# Patient Record
Sex: Male | Born: 1960 | Race: White | Hispanic: No | Marital: Single | Smoking: Never smoker
Health system: Southern US, Community
[De-identification: ages and names within clinical notes are randomized; demographics above are authoritative.]

## PROBLEM LIST (undated history)

## (undated) DIAGNOSIS — I10 Essential (primary) hypertension: Secondary | ICD-10-CM

## (undated) DIAGNOSIS — N200 Calculus of kidney: Secondary | ICD-10-CM

## (undated) HISTORY — PX: KNEE SURGERY: SHX244

---

## 1998-02-21 ENCOUNTER — Emergency Department (HOSPITAL_COMMUNITY): Admission: EM | Admit: 1998-02-21 | Discharge: 1998-02-21 | Payer: Self-pay | Admitting: Emergency Medicine

## 1998-04-08 ENCOUNTER — Emergency Department (HOSPITAL_COMMUNITY): Admission: EM | Admit: 1998-04-08 | Discharge: 1998-04-08 | Payer: Self-pay | Admitting: Emergency Medicine

## 1998-04-22 ENCOUNTER — Emergency Department (HOSPITAL_COMMUNITY): Admission: EM | Admit: 1998-04-22 | Discharge: 1998-04-22 | Payer: Self-pay | Admitting: Emergency Medicine

## 1998-10-19 ENCOUNTER — Emergency Department (HOSPITAL_COMMUNITY): Admission: EM | Admit: 1998-10-19 | Discharge: 1998-10-19 | Payer: Self-pay | Admitting: Emergency Medicine

## 1999-01-04 ENCOUNTER — Emergency Department (HOSPITAL_COMMUNITY): Admission: EM | Admit: 1999-01-04 | Discharge: 1999-01-04 | Payer: Self-pay | Admitting: Emergency Medicine

## 1999-05-25 ENCOUNTER — Encounter: Payer: Self-pay | Admitting: Urology

## 1999-05-25 ENCOUNTER — Ambulatory Visit (HOSPITAL_BASED_OUTPATIENT_CLINIC_OR_DEPARTMENT_OTHER): Admission: RE | Admit: 1999-05-25 | Discharge: 1999-05-25 | Payer: Self-pay | Admitting: Urology

## 2000-02-19 ENCOUNTER — Emergency Department (HOSPITAL_COMMUNITY): Admission: EM | Admit: 2000-02-19 | Discharge: 2000-02-19 | Payer: Self-pay | Admitting: *Deleted

## 2001-01-17 ENCOUNTER — Encounter: Payer: Self-pay | Admitting: Emergency Medicine

## 2001-01-17 ENCOUNTER — Emergency Department (HOSPITAL_COMMUNITY): Admission: EM | Admit: 2001-01-17 | Discharge: 2001-01-17 | Payer: Self-pay | Admitting: Emergency Medicine

## 2001-02-01 ENCOUNTER — Ambulatory Visit (HOSPITAL_COMMUNITY): Admission: RE | Admit: 2001-02-01 | Discharge: 2001-02-01 | Payer: Self-pay | Admitting: Orthopedic Surgery

## 2001-02-01 ENCOUNTER — Encounter: Payer: Self-pay | Admitting: Specialist

## 2001-02-27 ENCOUNTER — Ambulatory Visit (HOSPITAL_COMMUNITY): Admission: RE | Admit: 2001-02-27 | Discharge: 2001-02-27 | Payer: Self-pay | Admitting: Specialist

## 2001-11-17 ENCOUNTER — Encounter: Payer: Self-pay | Admitting: Internal Medicine

## 2001-11-17 ENCOUNTER — Inpatient Hospital Stay (HOSPITAL_COMMUNITY): Admission: EM | Admit: 2001-11-17 | Discharge: 2001-11-18 | Payer: Self-pay | Admitting: Emergency Medicine

## 2010-01-27 ENCOUNTER — Emergency Department (HOSPITAL_COMMUNITY): Admission: EM | Admit: 2010-01-27 | Discharge: 2010-01-27 | Payer: Self-pay | Admitting: Emergency Medicine

## 2010-09-24 LAB — COMPREHENSIVE METABOLIC PANEL
ALT: 30 U/L (ref 0–53)
AST: 30 U/L (ref 0–37)
Albumin: 4.4 g/dL (ref 3.5–5.2)
Alkaline Phosphatase: 66 U/L (ref 39–117)
BUN: 20 mg/dL (ref 6–23)
CO2: 27 mEq/L (ref 19–32)
Calcium: 9.7 mg/dL (ref 8.4–10.5)
Chloride: 105 mEq/L (ref 96–112)
Creatinine, Ser: 1.52 mg/dL — ABNORMAL HIGH (ref 0.4–1.5)
GFR calc Af Amer: 59 mL/min — ABNORMAL LOW (ref >60)
GFR calc non Af Amer: 49 mL/min — ABNORMAL LOW (ref >60)
Glucose, Bld: 127 mg/dL — ABNORMAL HIGH (ref 70–99)
Potassium: 4.4 mEq/L (ref 3.5–5.1)
Sodium: 141 mEq/L (ref 135–145)
Total Bilirubin: 0.9 mg/dL (ref 0.3–1.2)
Total Protein: 7.3 g/dL (ref 6.0–8.3)

## 2010-09-24 LAB — URINALYSIS, ROUTINE W REFLEX MICROSCOPIC
Bilirubin Urine: NEGATIVE
Glucose, UA: NEGATIVE mg/dL
Leukocytes, UA: NEGATIVE
Nitrite: NEGATIVE
Protein, ur: NEGATIVE mg/dL
Specific Gravity, Urine: 1.015 (ref 1.005–1.030)
Urobilinogen, UA: 0.2 mg/dL (ref 0.0–1.0)
pH: 7 (ref 5.0–8.0)

## 2010-09-24 LAB — URINE MICROSCOPIC-ADD ON

## 2010-11-25 NOTE — Op Note (Signed)
[place]  Patient:    Cody Haynes                         MRN: [id number] Proc. Date: 05/25/99 Adm. Date:  [id number] Attending:  Katherine Roan                           Operative Report  PREOPERATIVE DIAGNOSIS:  Right distal ureteral obstruction with history of pain and hematuria, rule out ureterocele.  POSTOPERATIVE DIAGNOSIS:  Right distal ureteral obstruction with history of pain and hematuria, rule out ureterocele.  OPERATION PERFORMED:  Cystoscopy, right ureteral balloon dilation and ureteroscopy.  SURGEON:  Rozanna Boer., M.D.  ANESTHESIA:  General.  BRIEF HISTORY:  This 50 year old white male has had at least four episodes of pain in the last year.  The first was September 1999, the next was in March 2000 and  then in June again it happened and this time of CT scan a 3 mm distal right ureteral stone was seen on IVP.  This was later gone on KUB in July but the stone was never recovered.  He had recurrence of pain last month and enters now for dilation of what looks to be a ureterocele on IVP.  The distal ureter causing intermittent obstruction with small stones that he apparently is passing.  DESCRIPTION OF PROCEDURE:  The patient was placed in the dorsal lithotomy position and prepped and draped with Betadine in the usual sterile fashion and given IV Ancef and Tobramycin IV.  A #21 panendoscope was used to inspect the anterior urethra and no strictures were seen.  The posterior urethra was not obstructed nd the bladder was entered.  Pictures were made of the ureteral orifices on both sides.  The right was a little bit smaller but in normal location.  It was not  typical ureterocele but did appear to be much tighter and smaller than the one n the left.  A guide wire was passed up the ureter and under fluoroscopy, no stones could be seen.  Over the guide wire a 4 cm ureteral balloon dilator was  passed,  inflated to 14 atmospheres and kept maintained there for five timed minutes. When this was done, the orifice now looked normal as did the left.  A 7 French ureteroscope was then passed up the ureter alongside the guide wire but no stones were seen.  The scope was passed all the way up to the proximal third of the ureter without difficulty.  The scope was then removed and over the guide wire a 6 [place] by 26 cm length double-J ureteral stent was fashioned under fluoroscopy with a string on the distal end.  The guide wire was removed.  The string was then taped to the penis after draining the bladder.  The patient was then taken to the recovery room in good condition to be later discharged as an outpatient. DD:  05/25/99 TD:  05/26/99 Job: [id number]

## 2010-11-25 NOTE — Op Note (Signed)
[facility]  Patient:    Cody Haynes, Cody Haynes Visit Number: [id number] MRN: [id number]          Service Type: DSU Location: DAY Attending Physician:  Rocky Crafts Proc. Date: 02/27/01 Adm. Date:  02/27/2001                             Operative Report  PREOPERATIVE DIAGNOSIS:  Torn lateral meniscus bucket handle.  POSTOPERATIVE DIAGNOSIS:  Torn lateral meniscus bucket handle.  PROCEDURE:  Partial lateral meniscectomy.  DESCRIPTION OF PROCEDURE:  After suitable general anesthesia, the leg is exsanguinated and upper thigh tourniquet inflated to 350 mmHg. He was then placed into the leg holder and prepped and draped routinely. Arthroscopy was carried out with the usual portals with the lateral parapatellar inflow portal and an anterolateral portal for the scope and anteromedial instrumentation. Pertinent pathology was confined to the lateral joint where there was a bucket handle tear of the lateral meniscus which was locked but was reducible. It was felt that removal was indicated even though it was pretty close to the red zone. The meniscus was then excised with a combination of punch forceps and rotatory meniscotome leaving a nice smooth posterior portal and tapering it at the middle third with plenty of meniscus between it and the obtuse tendon. Good stable meniscus remaining. At the end of the procedure, 20 cc of 0.5% plain Marcaine into the knee with some additional plain Marcaine in the punch wounds. A nice compression dressing and he goes to recovery in good condition. The tourniquet was down in under an hour. He goes to recovery in good condition. Attending Physician:  Rocky Crafts DD:  02/27/01 TD:  02/27/01 Job: [id number]

## 2011-06-05 ENCOUNTER — Emergency Department (HOSPITAL_COMMUNITY)
Admission: EM | Admit: 2011-06-05 | Discharge: 2011-06-05 | Disposition: A | Discharge status: Home / Self Care | Payer: Managed Care, Other (non HMO) | Attending: Emergency Medicine | Admitting: Emergency Medicine

## 2011-06-05 ENCOUNTER — Emergency Department (HOSPITAL_COMMUNITY): Payer: Managed Care, Other (non HMO)

## 2011-06-05 DIAGNOSIS — K7689 Other specified diseases of liver: Secondary | ICD-10-CM | POA: Insufficient documentation

## 2011-06-05 DIAGNOSIS — R109 Unspecified abdominal pain: Secondary | ICD-10-CM | POA: Insufficient documentation

## 2011-06-05 DIAGNOSIS — N134 Hydroureter: Secondary | ICD-10-CM

## 2011-06-05 DIAGNOSIS — N1330 Unspecified hydronephrosis: Secondary | ICD-10-CM | POA: Insufficient documentation

## 2011-06-05 DIAGNOSIS — I10 Essential (primary) hypertension: Secondary | ICD-10-CM | POA: Insufficient documentation

## 2011-06-05 DIAGNOSIS — N201 Calculus of ureter: Secondary | ICD-10-CM | POA: Insufficient documentation

## 2011-06-05 HISTORY — DX: Essential (primary) hypertension: I10

## 2011-06-05 LAB — DIFFERENTIAL
Basophils Relative: 0 % (ref 0–1)
Eosinophils Absolute: 0.1 10*3/uL (ref 0.0–0.7)
Eosinophils Relative: 1 % (ref 0–5)
Monocytes Relative: 3 % (ref 3–12)
Neutrophils Relative %: 88 % — ABNORMAL HIGH (ref 43–77)

## 2011-06-05 LAB — BASIC METABOLIC PANEL
BUN: 26 mg/dL — ABNORMAL HIGH (ref 6–23)
Calcium: 10.2 mg/dL (ref 8.4–10.5)
Creatinine, Ser: 1.44 mg/dL — ABNORMAL HIGH (ref 0.50–1.35)
GFR calc Af Amer: 64 mL/min — ABNORMAL LOW (ref >90)
GFR calc non Af Amer: 55 mL/min — ABNORMAL LOW (ref >90)
Potassium: 3.8 mEq/L (ref 3.5–5.1)

## 2011-06-05 LAB — CBC
Hemoglobin: 14.2 g/dL (ref 13.0–17.0)
MCH: 31 pg (ref 26.0–34.0)
MCHC: 35.2 g/dL (ref 30.0–36.0)
MCV: 88 fL (ref 78.0–100.0)

## 2011-06-05 LAB — URINALYSIS, ROUTINE W REFLEX MICROSCOPIC
Bilirubin Urine: NEGATIVE
Nitrite: NEGATIVE
Urobilinogen, UA: 0.2 mg/dL (ref 0.0–1.0)
pH: 5.5 (ref 5.0–8.0)

## 2011-06-05 LAB — URINE MICROSCOPIC-ADD ON

## 2011-06-05 MED ORDER — TAMSULOSIN HCL 0.4 MG PO CAPS: 0.4000 mg | ORAL_CAPSULE | Freq: Every day | ORAL | Status: DC

## 2011-06-05 MED ORDER — MORPHINE SULFATE 4 MG/ML IJ SOLN
8.0000 mg | Freq: Once | INTRAMUSCULAR | Status: AC
  Administered 2011-06-05: 8 mg via INTRAVENOUS
  Filled 2011-06-05: qty 2

## 2011-06-05 MED ORDER — SODIUM CHLORIDE 0.9 % IV BOLUS (SEPSIS)
1000.0000 mL | Freq: Once | INTRAVENOUS | Status: AC
  Administered 2011-06-05: 1000 mL via INTRAVENOUS

## 2011-06-05 MED ORDER — OXYCODONE-ACETAMINOPHEN 5-325 MG PO TABS: 1.0000 | ORAL_TABLET | Freq: Four times a day (QID) | ORAL | Status: AC | PRN

## 2011-06-05 MED ORDER — ONDANSETRON HCL 4 MG/2ML IJ SOLN
4.0000 mg | Freq: Once | INTRAMUSCULAR | Status: AC
Start: 2011-06-05 — End: 2011-06-05
  Administered 2011-06-05: 4 mg via INTRAVENOUS
  Filled 2011-06-05: qty 2

## 2011-06-05 MED ORDER — PROMETHAZINE HCL 12.5 MG PO TABS: 12.5000 mg | ORAL_TABLET | Freq: Four times a day (QID) | ORAL | Status: AC | PRN

## 2011-06-05 MED ORDER — OXYCODONE-ACETAMINOPHEN 5-325 MG PO TABS
2.0000 | ORAL_TABLET | Freq: Once | ORAL | Status: AC
  Administered 2011-06-05: 2 via ORAL
  Filled 2011-06-05: qty 2

## 2011-06-05 MED ORDER — NAPROXEN 500 MG PO TABS: 500.0000 mg | ORAL_TABLET | Freq: Two times a day (BID) | ORAL | Status: AC

## 2011-06-05 MED ORDER — KETOROLAC TROMETHAMINE 30 MG/ML IJ SOLN
30.0000 mg | Freq: Once | INTRAMUSCULAR | Status: AC
  Administered 2011-06-05: 30 mg via INTRAVENOUS
  Filled 2011-06-05: qty 1

## 2011-06-05 NOTE — ED Notes (Signed)
Hx kidney stones. Has n/v since 0700

## 2011-06-05 NOTE — ED Provider Notes (Signed)
History     CSN: [id number] Arrival date & time: 06/05/2011  9:41 AM   First MD Initiated Contact with Patient 06/05/11 1024      Chief Complaint  Patient presents with  . Nephrolithiasis    (Consider location/radiation/quality/duration/timing/severity/associated sxs/prior treatment) HPI Comments: History of kidney stones. Today at 7 AM with right flank pain radiating to his abdomen. Not worse with any palpation or movement. Associated nausea and vomiting. Multiple episodes of nonbloody, nonbilious emesis. No urinary symptoms. States the symptoms are identical to prior kidney stones.  No change in bowels  Patient is a 50 y.o. male presenting with flank pain. The history is provided by the patient. No language interpreter was used.  Flank Pain This is a new problem. The current episode started 3 to 5 hours ago. The problem occurs constantly. The problem has been gradually worsening. Associated symptoms include abdominal pain. Pertinent negatives include no chest pain, no headaches and no shortness of breath. The symptoms are aggravated by nothing. The symptoms are relieved by nothing. He has tried nothing for the symptoms. The treatment provided no relief.    Past Medical History  Diagnosis Date  . Hypertension     History reviewed. No pertinent past surgical history.  No family history on file.  History  Substance Use Topics  . Smoking status: Not on file  . Smokeless tobacco: Not on file  . Alcohol Use:       Review of Systems  Constitutional: Negative for fever, activity change, appetite change and fatigue.  HENT: Negative for congestion, sore throat, rhinorrhea, neck pain and neck stiffness.   Respiratory: Negative for cough, shortness of breath and wheezing.   Cardiovascular: Negative for chest pain and palpitations.  Gastrointestinal: Positive for nausea, vomiting and abdominal pain. Negative for diarrhea and constipation.  Genitourinary: Positive for flank pain.  Negative for dysuria, urgency and frequency.  Musculoskeletal: Negative for back pain, arthralgias and gait problem.  Neurological: Negative for dizziness, weakness, light-headedness, numbness and headaches.  All other systems reviewed and are negative.    Allergies  Review of patient's allergies indicates no known allergies.  Home Medications   Current Outpatient Rx  Name Route Sig Dispense Refill  . LISINOPRIL 20 MG PO TABS Oral Take 20 mg by mouth daily.      Marland Kitchen NAPROXEN 500 MG PO TABS Oral Take 1 tablet (500 mg total) by mouth 2 (two) times daily. 30 tablet 0  . OXYCODONE-ACETAMINOPHEN 5-325 MG PO TABS Oral Take 1-2 tablets by mouth every 6 (six) hours as needed for pain. 20 tablet 0  . PROMETHAZINE HCL 12.5 MG PO TABS Oral Take 1 tablet (12.5 mg total) by mouth every 6 (six) hours as needed for nausea. 30 tablet 0  . TAMSULOSIN HCL 0.4 MG PO CAPS Oral Take 1 capsule (0.4 mg total) by mouth daily. 10 capsule 0    BP 109/90  Pulse 64  Temp(Src) 98.4 F (36.9 C) (Oral)  Resp 18  SpO2 100%  Physical Exam  Nursing note and vitals reviewed. Constitutional: He is oriented to person, place, and time. He appears well-developed and well-nourished. He appears distressed (pain).  HENT:  Head: Normocephalic and atraumatic.  Mouth/Throat: Oropharynx is clear and moist.  Eyes: Conjunctivae and EOM are normal. Pupils are equal, round, and reactive to light.  Neck: Normal range of motion. Neck supple.  Cardiovascular: Normal rate, regular rhythm, normal heart sounds and intact distal pulses.  Exam reveals no gallop and no friction rub.  No murmur heard. Pulmonary/Chest: Effort normal and breath sounds normal. No respiratory distress.  Abdominal: Soft. Bowel sounds are normal. There is no tenderness. There is no rebound and no guarding.       Could not exacerbate pain on palpation  Musculoskeletal: Normal range of motion. He exhibits no tenderness.       No cva tenderness  Neurological:  He is alert and oriented to person, place, and time.  Skin: Skin is warm and dry. No rash noted.    ED Course  Procedures (including critical care time)  Labs Reviewed  URINALYSIS, ROUTINE W REFLEX MICROSCOPIC - Abnormal; Notable for the following:    Appearance CLOUDY (*)    Hgb urine dipstick LARGE (*)    Ketones, ur TRACE (*)    All other components within normal limits  CBC - Abnormal; Notable for the following:    WBC 11.4 (*)    All other components within normal limits  DIFFERENTIAL - Abnormal; Notable for the following:    Neutrophils Relative 88 (*)    Neutro Abs 10.0 (*)    Lymphocytes Relative 9 (*)    All other components within normal limits  BASIC METABOLIC PANEL - Abnormal; Notable for the following:    Glucose, Bld 143 (*)    BUN 26 (*)    Creatinine, Ser 1.44 (*)    GFR calc non Af Amer 55 (*)    GFR calc Af Amer 64 (*)    All other components within normal limits  URINE MICROSCOPIC-ADD ON - Abnormal; Notable for the following:    Bacteria, UA FEW (*)    Crystals URIC ACID CRYSTALS (*)    All other components within normal limits   Ct Abdomen Pelvis Wo Contrast  06/05/2011  *RADIOLOGY REPORT*  Clinical Data: Right flank pain with nausea and vomiting.  History of kidney stones.  CT ABDOMEN AND PELVIS WITHOUT CONTRAST  Technique:  Multidetector CT imaging of the abdomen and pelvis was performed following the standard protocol without intravenous contrast.  Comparison: Abdominal pelvic CT 01/27/2010.  Findings: There is an obstructing 5 mm calculus in the mid right ureter, seen at the L4 level (image 49).  This is visible on the scout image.  There is associated recurrent proximal hydronephrosis and hydroureter with moderate perinephric soft tissue stranding. Numerous left greater than right renal calyceal calculi are similar in size and distribution to the prior examination.  There is no evidence of left ureteral or bladder calculus.  Mild dependent atelectasis is  present in the right lung base.  The liver demonstrates diffuse steatosis with focal sparing around the gallbladder.  The gallbladder, biliary system, pancreas, spleen and adrenal glands appear unremarkable.  The bowel gas pattern is normal.  The bladder, prostate gland and seminal vesicles appear unremarkable.  Chronic calcification between the colon and seminal vesicles is unchanged.  Chronic left- sided pars defect at L5 appears unchanged.  IMPRESSION:  1.  Recurrent mid right ureteral calculus with associated hydronephrosis and perinephric soft tissue stranding.  Of note, this obstructing calculus is similar in size and location to the previously demonstrated mid right ureteral calculus. 2.  Bilateral renal calyceal calculi. 3.  Diffuse hepatic steatosis.  Original Report Authenticated By: Gerrianne Scale, M.D.     1. Ureteral calculus   2. Hydronephrosis   3. Hydroureter       MDM  Patient with a ureteral stone which is 5 mm. There is some associated hydroureter and hydronephrosis concerning for  obstructing stone. There is no evidence of infection. He received IV fluids, pain medication, antiemetics. I discussed the case with Dr. Annabell Howells the urologist on-call who recommends follow up in office this week.  I explained the slight elevation on creatinine although stable when compared to prior. This could be same stone as seen last year. I explained the importance of follow up with urology as his pain and intervention. He'll be discharged home with Flomax, antiemetics, pain medication, Naprosyn.  Provided sx for which to return        Dayton Bailiff, MD 06/05/11 1534

## 2011-06-05 NOTE — ED Notes (Signed)
Pt reports acute onset of RLQ abdominal pain with N/V beginning at 07:00 this morning. Reports hx of kidney stones, denies urinary problems.

## 2011-06-05 NOTE — ED Notes (Signed)
[id number]<AV> Expected date:<BR> Expected time:<BR> Means of arrival:<BR> Comments:<BR> Closed.

## 2013-10-30 ENCOUNTER — Emergency Department (HOSPITAL_COMMUNITY): Payer: BC Managed Care – PPO

## 2013-10-30 ENCOUNTER — Emergency Department (HOSPITAL_COMMUNITY)
Admission: EM | Admit: 2013-10-30 | Discharge: 2013-10-30 | Disposition: A | Discharge status: Home / Self Care | Payer: BC Managed Care – PPO | Attending: Emergency Medicine | Admitting: Emergency Medicine

## 2013-10-30 ENCOUNTER — Encounter (HOSPITAL_COMMUNITY): Payer: Self-pay | Admitting: Emergency Medicine

## 2013-10-30 DIAGNOSIS — R109 Unspecified abdominal pain: Secondary | ICD-10-CM

## 2013-10-30 DIAGNOSIS — I10 Essential (primary) hypertension: Secondary | ICD-10-CM | POA: Insufficient documentation

## 2013-10-30 DIAGNOSIS — Z79899 Other long term (current) drug therapy: Secondary | ICD-10-CM | POA: Insufficient documentation

## 2013-10-30 DIAGNOSIS — N200 Calculus of kidney: Secondary | ICD-10-CM | POA: Insufficient documentation

## 2013-10-30 HISTORY — DX: Calculus of kidney: N20.0

## 2013-10-30 LAB — CBC WITH DIFFERENTIAL/PLATELET
BASOS ABS: 0 10*3/uL (ref 0.0–0.1)
Basophils Relative: 0 % (ref 0–1)
Eosinophils Absolute: 0.3 10*3/uL (ref 0.0–0.7)
Eosinophils Relative: 4 % (ref 0–5)
HCT: 36.7 % — ABNORMAL LOW (ref 39.0–52.0)
Hemoglobin: 12.8 g/dL — ABNORMAL LOW (ref 13.0–17.0)
LYMPHS PCT: 22 % (ref 12–46)
Lymphs Abs: 1.6 10*3/uL (ref 0.7–4.0)
MCH: 30.1 pg (ref 26.0–34.0)
MCHC: 34.9 g/dL (ref 30.0–36.0)
MCV: 86.4 fL (ref 78.0–100.0)
Monocytes Absolute: 0.4 10*3/uL (ref 0.1–1.0)
Monocytes Relative: 5 % (ref 3–12)
NEUTROS ABS: 5 10*3/uL (ref 1.7–7.7)
NEUTROS PCT: 68 % (ref 43–77)
PLATELETS: 223 10*3/uL (ref 150–400)
RBC: 4.25 MIL/uL (ref 4.22–5.81)
RDW: 12.9 % (ref 11.5–15.5)
WBC: 7.3 10*3/uL (ref 4.0–10.5)

## 2013-10-30 LAB — URINALYSIS, ROUTINE W REFLEX MICROSCOPIC
BILIRUBIN URINE: NEGATIVE
Glucose, UA: 100 mg/dL — AB
HGB URINE DIPSTICK: NEGATIVE
Ketones, ur: NEGATIVE mg/dL
Leukocytes, UA: NEGATIVE
Nitrite: NEGATIVE
Protein, ur: NEGATIVE mg/dL
SPECIFIC GRAVITY, URINE: 1.01 (ref 1.005–1.030)
UROBILINOGEN UA: 0.2 mg/dL (ref 0.0–1.0)
pH: 7 (ref 5.0–8.0)

## 2013-10-30 LAB — COMPREHENSIVE METABOLIC PANEL
ALT: 27 U/L (ref 0–53)
AST: 22 U/L (ref 0–37)
Albumin: 4.5 g/dL (ref 3.5–5.2)
Alkaline Phosphatase: 89 U/L (ref 39–117)
BUN: 17 mg/dL (ref 6–23)
CALCIUM: 9.8 mg/dL (ref 8.4–10.5)
CHLORIDE: 102 meq/L (ref 96–112)
CO2: 21 meq/L (ref 19–32)
Creatinine, Ser: 1.18 mg/dL (ref 0.50–1.35)
GFR calc Af Amer: 79 mL/min — ABNORMAL LOW (ref >90)
GFR, EST NON AFRICAN AMERICAN: 68 mL/min — AB (ref >90)
Glucose, Bld: 133 mg/dL — ABNORMAL HIGH (ref 70–99)
POTASSIUM: 3.7 meq/L (ref 3.7–5.3)
SODIUM: 141 meq/L (ref 137–147)
Total Bilirubin: 0.4 mg/dL (ref 0.3–1.2)
Total Protein: 7.6 g/dL (ref 6.0–8.3)

## 2013-10-30 MED ORDER — TAMSULOSIN HCL 0.4 MG PO CAPS: 0.4000 mg | ORAL_CAPSULE | Freq: Once | ORAL | Status: DC

## 2013-10-30 MED ORDER — FENTANYL CITRATE 0.05 MG/ML IJ SOLN
50.0000 ug | Freq: Once | INTRAMUSCULAR | Status: AC
  Administered 2013-10-30: 50 ug via INTRAVENOUS
  Filled 2013-10-30: qty 2

## 2013-10-30 MED ORDER — TAMSULOSIN HCL 0.4 MG PO CAPS
0.4000 mg | ORAL_CAPSULE | Freq: Once | ORAL | Status: AC
  Administered 2013-10-30: 0.4 mg via ORAL
  Filled 2013-10-30: qty 1

## 2013-10-30 MED ORDER — HYDROMORPHONE HCL PF 1 MG/ML IJ SOLN
1.0000 mg | Freq: Once | INTRAMUSCULAR | Status: AC
  Administered 2013-10-30: 1 mg via INTRAVENOUS
  Filled 2013-10-30: qty 1

## 2013-10-30 MED ORDER — ONDANSETRON HCL 4 MG/2ML IJ SOLN
4.0000 mg | Freq: Once | INTRAMUSCULAR | Status: AC
  Administered 2013-10-30: 4 mg via INTRAVENOUS
  Filled 2013-10-30: qty 2

## 2013-10-30 MED ORDER — ONDANSETRON HCL 4 MG PO TABS: 4.0000 mg | ORAL_TABLET | Freq: Four times a day (QID) | ORAL | Status: DC

## 2013-10-30 MED ORDER — SODIUM CHLORIDE 0.9 % IV BOLUS (SEPSIS)
1000.0000 mL | Freq: Once | INTRAVENOUS | Status: AC
  Administered 2013-10-30: 1000 mL via INTRAVENOUS

## 2013-10-30 MED ORDER — ONDANSETRON HCL 4 MG/2ML IJ SOLN: 4.0000 mg | Freq: Once | INTRAMUSCULAR | Status: DC

## 2013-10-30 MED ORDER — OXYCODONE-ACETAMINOPHEN 5-325 MG PO TABS: 1.0000 | ORAL_TABLET | ORAL | Status: DC | PRN

## 2013-10-30 MED ORDER — KETOROLAC TROMETHAMINE 30 MG/ML IJ SOLN
30.0000 mg | Freq: Once | INTRAMUSCULAR | Status: AC
  Administered 2013-10-30: 30 mg via INTRAVENOUS
  Filled 2013-10-30: qty 1

## 2013-10-30 NOTE — ED Notes (Signed)
Per pt report: pt from home: pt hx of kidney stones.  Pt reports left flank pain that began around 03:00. Pt vomited multiple times at home.

## 2013-10-30 NOTE — ED Notes (Signed)
Pt reports "that last shot did its job." Pt denies pain at present time.

## 2013-10-30 NOTE — Discharge Instructions (Signed)
Kidney Stones  Kidney stones (urolithiasis) are deposits that form inside your kidneys. The intense pain is caused by the stone moving through the urinary tract. When the stone moves, the ureter goes into spasm around the stone. The stone is usually passed in the urine.   CAUSES   · A disorder that makes certain neck glands produce too much parathyroid hormone (primary hyperparathyroidism).  · A buildup of uric acid crystals, similar to gout in your joints.  · Narrowing (stricture) of the ureter.  · A kidney obstruction present at birth (congenital obstruction).  · Previous surgery on the kidney or ureters.  · Numerous kidney infections.  SYMPTOMS   · Feeling sick to your stomach (nauseous).  · Throwing up (vomiting).  · Blood in the urine (hematuria).  · Pain that usually spreads (radiates) to the groin.  · Frequency or urgency of urination.  DIAGNOSIS   · Taking a history and physical exam.  · Blood or urine tests.  · CT scan.  · Occasionally, an examination of the inside of the urinary bladder (cystoscopy) is performed.  TREATMENT   · Observation.  · Increasing your fluid intake.  · Extracorporeal shock wave lithotripsy This is a noninvasive procedure that uses shock waves to break up kidney stones.  · Surgery may be needed if you have severe pain or persistent obstruction. There are various surgical procedures. Most of the procedures are performed with the use of small instruments. Only small incisions are needed to accommodate these instruments, so recovery time is minimized.  The size, location, and chemical composition are all important variables that will determine the proper choice of action for you. Talk to your health care provider to better understand your situation so that you will minimize the risk of injury to yourself and your kidney.   HOME CARE INSTRUCTIONS   · Drink enough water and fluids to keep your urine clear or pale yellow. This will help you to pass the stone or stone fragments.  · Strain  all urine through the provided strainer. Keep all particulate matter and stones for your health care provider to see. The stone causing the pain may be as small as a grain of salt. It is very important to use the strainer each and every time you pass your urine. The collection of your stone will allow your health care provider to analyze it and verify that a stone has actually passed. The stone analysis will often identify what you can do to reduce the incidence of recurrences.  · Only take over-the-counter or prescription medicines for pain, discomfort, or fever as directed by your health care provider.  · Make a follow-up appointment with your health care provider as directed.  · Get follow-up X-rays if required. The absence of pain does not always mean that the stone has passed. It may have only stopped moving. If the urine remains completely obstructed, it can cause loss of kidney function or even complete destruction of the kidney. It is your responsibility to make sure X-rays and follow-ups are completed. Ultrasounds of the kidney can show blockages and the status of the kidney. Ultrasounds are not associated with any radiation and can be performed easily in a matter of minutes.  SEEK MEDICAL CARE IF:  · You experience pain that is progressive and unresponsive to any pain medicine you have been prescribed.  SEEK IMMEDIATE MEDICAL CARE IF:   · Pain cannot be controlled with the prescribed medicine.  · You have a fever   or shaking chills.  · The severity or intensity of pain increases over 18 hours and is not relieved by pain medicine.  · You develop a new onset of abdominal pain.  · You feel faint or pass out.  · You are unable to urinate.  MAKE SURE YOU:   · Understand these instructions.  · Will watch your condition.  · Will get help right away if you are not doing well or get worse.  Document Released: 06/26/2005 Document Revised: 02/26/2013 Document Reviewed: 11/27/2012  [employer]® Patient Information ©2014  [employer].

## 2013-10-30 NOTE — ED Provider Notes (Signed)
CSN: [id number]     Arrival date & time 10/30/13  [id number] History   First MD Initiated Contact with Patient 10/30/13 [id number]     Chief Complaint  Patient presents with  . Flank Pain     (Consider location/radiation/quality/duration/timing/severity/associated sxs/prior Treatment) HPI Comments: Pt is a 53 y.o. male with Pmhx as above who presents with recurrent L flank pain starting suddenly at 3am this morning, sharp constant and c/w prior episodes of kidney stones. +assoc n/v, no fevers. Pt reports he saw [facility] about 1 mon ago for similar, milder pain, but didn't f/u because symptoms improved. He states he had an US and XR.   Patient is a 53 y.o. male presenting with flank pain. The history is provided by the patient. No language interpreter was used.  Flank Pain This is a recurrent problem. The current episode started 3 to 5 hours ago. The problem occurs constantly. The problem has not changed since onset.Pertinent negatives include no chest pain, no abdominal pain, no headaches and no shortness of breath. Nothing aggravates the symptoms. Nothing relieves the symptoms. He has tried nothing for the symptoms. The treatment provided no relief.    Past Medical History  Diagnosis Date  . Hypertension   . Kidney stones    History reviewed. No pertinent past surgical history. No family history on file. History  Substance Use Topics  . Smoking status: Never Smoker   . Smokeless tobacco: Not on file  . Alcohol Use: No    Review of Systems  Constitutional: Negative for fever, activity change, appetite change and fatigue.  HENT: Negative for congestion, facial swelling, rhinorrhea and trouble swallowing.   Eyes: Negative for photophobia and pain.  Respiratory: Negative for cough, chest tightness and shortness of breath.   Cardiovascular: Negative for chest pain and leg swelling.  Gastrointestinal: Negative for nausea, vomiting, abdominal pain, diarrhea and constipation.  Endocrine:  Negative for polydipsia and polyuria.  Genitourinary: Positive for flank pain. Negative for dysuria, urgency, decreased urine volume and difficulty urinating.  Musculoskeletal: Negative for back pain and gait problem.  Skin: Negative for color change, rash and wound.  Allergic/Immunologic: Negative for immunocompromised state.  Neurological: Negative for dizziness, facial asymmetry, speech difficulty, weakness, numbness and headaches.  Psychiatric/Behavioral: Negative for confusion, decreased concentration and agitation.      Allergies  Review of patient's allergies indicates no known allergies.  Home Medications   Prior to Admission medications   Medication Sig Start Date End Date Taking? Authorizing Provider  lisinopril (PRINIVIL,ZESTRIL) 20 MG tablet Take 20 mg by mouth daily.      Historical Provider, MD  Tamsulosin HCl (FLOMAX) 0.4 MG CAPS Take 1 capsule (0.4 mg total) by mouth daily. 06/05/11   Dayton BailiffAndrew King, MD   BP 164/92  Pulse 51  Resp 16  Ht 5\' 11"  (1.803 m)  Wt 210 lb (95.255 kg)  BMI 29.30 kg/m2  SpO2 98% Physical Exam  Constitutional: He is oriented to person, place, and time. He appears well-developed and well-nourished. No distress.  HENT:  Head: Normocephalic and atraumatic.  Mouth/Throat: No oropharyngeal exudate.  Eyes: Pupils are equal, round, and reactive to light.  Neck: Normal range of motion. Neck supple.  Cardiovascular: Normal rate, regular rhythm and normal heart sounds.  Exam reveals no gallop and no friction rub.   No murmur heard. Pulmonary/Chest: Effort normal and breath sounds normal. No respiratory distress. He has no wheezes. He has no rales.  Abdominal: Soft. Bowel sounds are normal. He exhibits no  distension and no mass. There is no tenderness. There is CVA tenderness (L CVA). There is no rebound and no guarding.  Musculoskeletal: Normal range of motion. He exhibits no edema and no tenderness.  Neurological: He is alert and oriented to person,  place, and time.  Skin: Skin is warm and dry.  Psychiatric: He has a normal mood and affect.    ED Course  Procedures (including critical care time) Labs Review Labs Reviewed  CBC WITH DIFFERENTIAL - Abnormal; Notable for the following:    Hemoglobin 12.8 (*)    HCT 36.7 (*)    All other components within normal limits  COMPREHENSIVE METABOLIC PANEL - Abnormal; Notable for the following:    Glucose, Bld 133 (*)    GFR calc non Af Amer 68 (*)    GFR calc Af Amer 79 (*)    All other components within normal limits  URINALYSIS, ROUTINE W REFLEX MICROSCOPIC - Abnormal; Notable for the following:    Glucose, UA 100 (*)    All other components within normal limits  URINE CULTURE    Imaging Review [facility]  10/30/2013   CLINICAL DATA:  Left flank pain  EXAM: RENAL/URINARY TRACT ULTRASOUND COMPLETE  COMPARISON:  Prior renal ultrasound 08/12/2013  FINDINGS: Right Kidney:  Length: 11.2 cm. Echogenicity within normal limits. Extrarenal pelvis without hydronephrosis. No mass identified.  Left Kidney:  Length: 12 cm. Echogenicity within normal limits. No mass or hydronephrosis visualized. The echogenic focus in the upper pole collecting system consistent with nephrolithiasis.  Bladder:  Appears normal for degree of bladder distention.  IMPRESSION: 1. Negative for hydronephrosis. 2. Partially extrarenal pelvis on the right. 3. Probable left upper pole nephrolithiasis.   Electronically Signed   By: Malachy MoanHeath  McCullough M.D.   On: 10/30/2013 08:03     EKG Interpretation None      MDM   Final diagnoses:  Left flank pain  Nephrolithiasis    Pt is a 53 y.o. male with Pmhx as above who presents with recurrent L flank pain starting suddenly at 3am this morning, sharp constant and c/w prior episodes of kidney stones. +assoc n/v, no fevers. Pt reports he saw [facility] about 1 mon ago for similar, milder pain, but didn't f/u because symptoms improved. He states he had an US and XR. On PE, pt  sleeping initially, states pain has gone from 12/10, to 8/10 after fentanyl. +L CVA tenderness. WBC nml, Cr nml. UA not infected.  US w/o hydronephrosis, probably L upper pole nephrolithiasis.   11:53 AM Pt feeling improved after flomax, diladid, toradol. Suspect nephrolithiasis. Will d/ chome w/ percocet, flomax, zofran. Return precautions given for new or worsening symptoms including worsening or pain characteristics different from prior episodes, fever, inability to tolerate PO. Pt instructed to f/u with [facility] ASAP.       Shanna CiscoMegan E Docherty, MD 10/30/13 1153

## 2013-10-30 NOTE — Progress Notes (Signed)
Pt confirms no pcp he confirms [employer] coverage. He agreed to receive a list of in network providers Cm provided pt with a list of in network providers, web site to get further and information how to call customer service toll free number to assist further as needed Pt voiced understanding

## 2013-10-30 NOTE — ED Notes (Signed)
US at bedside

## 2013-10-31 LAB — URINE CULTURE
COLONY COUNT: NO GROWTH
CULTURE: NO GROWTH

## 2014-03-29 ENCOUNTER — Emergency Department (HOSPITAL_COMMUNITY)
Admission: EM | Admit: 2014-03-29 | Discharge: 2014-03-29 | Disposition: A | Discharge status: Home / Self Care | Payer: BC Managed Care – PPO | Attending: Emergency Medicine | Admitting: Emergency Medicine

## 2014-03-29 ENCOUNTER — Emergency Department (HOSPITAL_COMMUNITY): Payer: BC Managed Care – PPO

## 2014-03-29 ENCOUNTER — Encounter (HOSPITAL_COMMUNITY): Payer: Self-pay | Admitting: Emergency Medicine

## 2014-03-29 DIAGNOSIS — M25571 Pain in right ankle and joints of right foot: Secondary | ICD-10-CM

## 2014-03-29 DIAGNOSIS — S99919A Unspecified injury of unspecified ankle, initial encounter: Secondary | ICD-10-CM

## 2014-03-29 DIAGNOSIS — Y92410 Unspecified street and highway as the place of occurrence of the external cause: Secondary | ICD-10-CM | POA: Diagnosis not present

## 2014-03-29 DIAGNOSIS — IMO0002 Reserved for concepts with insufficient information to code with codable children: Secondary | ICD-10-CM | POA: Diagnosis not present

## 2014-03-29 DIAGNOSIS — S335XXA Sprain of ligaments of lumbar spine, initial encounter: Secondary | ICD-10-CM | POA: Insufficient documentation

## 2014-03-29 DIAGNOSIS — Z87442 Personal history of urinary calculi: Secondary | ICD-10-CM | POA: Insufficient documentation

## 2014-03-29 DIAGNOSIS — S99929A Unspecified injury of unspecified foot, initial encounter: Secondary | ICD-10-CM

## 2014-03-29 DIAGNOSIS — Y9389 Activity, other specified: Secondary | ICD-10-CM | POA: Diagnosis not present

## 2014-03-29 DIAGNOSIS — S8990XA Unspecified injury of unspecified lower leg, initial encounter: Secondary | ICD-10-CM | POA: Insufficient documentation

## 2014-03-29 DIAGNOSIS — Z79899 Other long term (current) drug therapy: Secondary | ICD-10-CM | POA: Diagnosis not present

## 2014-03-29 DIAGNOSIS — S51012A Laceration without foreign body of left elbow, initial encounter: Secondary | ICD-10-CM

## 2014-03-29 DIAGNOSIS — S51009A Unspecified open wound of unspecified elbow, initial encounter: Secondary | ICD-10-CM | POA: Insufficient documentation

## 2014-03-29 DIAGNOSIS — S39012A Strain of muscle, fascia and tendon of lower back, initial encounter: Secondary | ICD-10-CM

## 2014-03-29 DIAGNOSIS — S80212A Abrasion, left knee, initial encounter: Secondary | ICD-10-CM

## 2014-03-29 DIAGNOSIS — I10 Essential (primary) hypertension: Secondary | ICD-10-CM | POA: Diagnosis not present

## 2014-03-29 LAB — COMPREHENSIVE METABOLIC PANEL
ALT: 22 U/L (ref 0–53)
AST: 18 U/L (ref 0–37)
Albumin: 4 g/dL (ref 3.5–5.2)
Alkaline Phosphatase: 82 U/L (ref 39–117)
Anion gap: 12 (ref 5–15)
BILIRUBIN TOTAL: 0.2 mg/dL — AB (ref 0.3–1.2)
BUN: 16 mg/dL (ref 6–23)
CALCIUM: 9.5 mg/dL (ref 8.4–10.5)
CO2: 26 meq/L (ref 19–32)
Chloride: 106 mEq/L (ref 96–112)
Creatinine, Ser: 1.2 mg/dL (ref 0.50–1.35)
GFR calc non Af Amer: 67 mL/min — ABNORMAL LOW (ref >90)
GFR, EST AFRICAN AMERICAN: 78 mL/min — AB (ref >90)
GLUCOSE: 109 mg/dL — AB (ref 70–99)
Potassium: 4 mEq/L (ref 3.7–5.3)
SODIUM: 144 meq/L (ref 137–147)
Total Protein: 7 g/dL (ref 6.0–8.3)

## 2014-03-29 LAB — CBC
HCT: 37.5 % — ABNORMAL LOW (ref 39.0–52.0)
Hemoglobin: 13 g/dL (ref 13.0–17.0)
MCH: 30.2 pg (ref 26.0–34.0)
MCHC: 34.7 g/dL (ref 30.0–36.0)
MCV: 87.2 fL (ref 78.0–100.0)
PLATELETS: 197 10*3/uL (ref 150–400)
RBC: 4.3 MIL/uL (ref 4.22–5.81)
RDW: 12.9 % (ref 11.5–15.5)
WBC: 8.1 10*3/uL (ref 4.0–10.5)

## 2014-03-29 LAB — ETHANOL: Alcohol, Ethyl (B): <11 mg/dL (ref 0–11)

## 2014-03-29 MED ORDER — TETANUS-DIPHTH-ACELL PERTUSSIS 5-2.5-18.5 LF-MCG/0.5 IM SUSP: 0.5000 mL | Freq: Once | INTRAMUSCULAR | Status: DC

## 2014-03-29 MED ORDER — FENTANYL CITRATE 0.05 MG/ML IJ SOLN: 50.0000 ug | Freq: Once | INTRAMUSCULAR | Status: DC

## 2014-03-29 MED ORDER — MORPHINE SULFATE 4 MG/ML IJ SOLN: 6.0000 mg | Freq: Once | INTRAMUSCULAR | Status: DC
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
## 2014-03-29 NOTE — Discharge Instructions (Signed)

## 2014-03-29 NOTE — ED Provider Notes (Signed)
I saw and evaluated the patient, reviewed the resident's note and I agree with the findings and plan.   EKG Interpretation None      Abdominal exam is benign.  Patient ambulatory in emergency department.  Discharge home in good condition.  Plain films  without abnormality.  Lyanne Co, MD 03/29/14 2021

## 2014-03-29 NOTE — ED Notes (Signed)
Phlebotomy at bedside.

## 2014-03-29 NOTE — ED Notes (Signed)
Pt remains in xray.
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
## 2014-03-29 NOTE — ED Notes (Signed)
Pt states he doesn't want anyone notified that he is here at this time.

## 2014-03-29 NOTE — ED Notes (Signed)
Patient transported to X-ray 

## 2014-03-29 NOTE — ED Notes (Signed)
Resident at bedside and notified of pt's L 4th and 5th digit pain and R foot pain.  Orders received.  Also notified pt refused medication.

## 2014-04-14 ENCOUNTER — Other Ambulatory Visit: Payer: Self-pay | Admitting: Urology

## 2014-04-15 ENCOUNTER — Encounter (HOSPITAL_COMMUNITY): Payer: Self-pay | Admitting: *Deleted

## 2014-04-16 ENCOUNTER — Ambulatory Visit (HOSPITAL_COMMUNITY)
Admission: RE | Admit: 2014-04-16 | Discharge: 2014-04-16 | Disposition: A | Discharge status: Home / Self Care | Payer: BC Managed Care – PPO | Source: Ambulatory Visit | Attending: Urology | Admitting: Urology

## 2014-04-16 ENCOUNTER — Encounter (HOSPITAL_COMMUNITY): Payer: Self-pay | Admitting: *Deleted

## 2014-04-16 ENCOUNTER — Encounter (HOSPITAL_COMMUNITY)
Admission: RE | Disposition: A | Discharge status: Home / Self Care | Payer: Self-pay | Source: Ambulatory Visit | Attending: Urology

## 2014-04-16 ENCOUNTER — Ambulatory Visit (HOSPITAL_COMMUNITY): Payer: BC Managed Care – PPO

## 2014-04-16 DIAGNOSIS — N201 Calculus of ureter: Secondary | ICD-10-CM | POA: Insufficient documentation

## 2014-04-16 DIAGNOSIS — I10 Essential (primary) hypertension: Secondary | ICD-10-CM | POA: Diagnosis not present

## 2014-04-16 DIAGNOSIS — N200 Calculus of kidney: Secondary | ICD-10-CM

## 2014-04-16 SURGERY — LITHOTRIPSY, ESWL
Anesthesia: LOCAL | Laterality: Left

## 2014-04-16 MED ORDER — CIPROFLOXACIN HCL 500 MG PO TABS
500.0000 mg | ORAL_TABLET | ORAL | Status: AC
  Administered 2014-04-16: 500 mg via ORAL
  Filled 2014-04-16: qty 1

## 2014-04-16 MED ORDER — DIPHENHYDRAMINE HCL 25 MG PO CAPS
25.0000 mg | ORAL_CAPSULE | ORAL | Status: AC
  Administered 2014-04-16: 25 mg via ORAL
  Filled 2014-04-16: qty 1

## 2014-04-16 MED ORDER — DIAZEPAM 5 MG PO TABS
10.0000 mg | ORAL_TABLET | ORAL | Status: AC
  Administered 2014-04-16: 10 mg via ORAL
  Filled 2014-04-16: qty 2

## 2014-04-16 MED ORDER — SODIUM CHLORIDE 0.9 % IV SOLN
INTRAVENOUS | Status: DC
  Administered 2014-04-16: 15:00:00 via INTRAVENOUS

## 2014-04-16 NOTE — Progress Notes (Signed)
Patient prepped and ready for lithotripsy. States no pain at this time.

## 2014-04-16 NOTE — Op Note (Signed)
See [facility] note scanned into chart. 

## 2014-04-16 NOTE — Discharge Instructions (Signed)
See [facility] discharge instructions in chart.  
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
## 2014-04-16 NOTE — Progress Notes (Signed)
[facility] at 1100. [facility] called report that patient had 15mg  Morphine injection IM at 1030. Patient resting comfortably at moment awaiting lithotripsy.
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
## 2016-07-11 ENCOUNTER — Telehealth (INDEPENDENT_AMBULATORY_CARE_PROVIDER_SITE_OTHER): Payer: Self-pay | Admitting: Orthopedic Surgery

## 2016-09-26 NOTE — Telephone Encounter (Signed)
m °

## 2016-10-05 ENCOUNTER — Ambulatory Visit (INDEPENDENT_AMBULATORY_CARE_PROVIDER_SITE_OTHER): Payer: BLUE CROSS/BLUE SHIELD | Admitting: Physician Assistant

## 2016-10-05 VITALS — BP 132/82 | HR 64 | Temp 98.4°F | Resp 16 | Ht 71.0 in | Wt 210.2 lb

## 2016-10-05 DIAGNOSIS — Z1329 Encounter for screening for other suspected endocrine disorder: Secondary | ICD-10-CM | POA: Diagnosis not present

## 2016-10-05 DIAGNOSIS — Z1211 Encounter for screening for malignant neoplasm of colon: Secondary | ICD-10-CM

## 2016-10-05 DIAGNOSIS — Z0000 Encounter for general adult medical examination without abnormal findings: Secondary | ICD-10-CM | POA: Diagnosis not present

## 2016-10-05 DIAGNOSIS — Z13220 Encounter for screening for lipoid disorders: Secondary | ICD-10-CM | POA: Diagnosis not present

## 2016-10-05 DIAGNOSIS — Z13228 Encounter for screening for other metabolic disorders: Secondary | ICD-10-CM

## 2016-10-05 DIAGNOSIS — Z130 Encounter for screening for diseases of the blood and blood-forming organs and certain disorders involving the immune mechanism: Secondary | ICD-10-CM

## 2016-10-05 NOTE — Addendum Note (Signed)
Addended by: Baldwin CrownJOHNSON, Gionna Polak D on: 10/05/2016 04:56 PM   Modules accepted: Kipp BroodSmartSet

## 2016-10-05 NOTE — Progress Notes (Signed)
Cody Haynes  MRN: [id number] DOB: 09-20-1960  Subjective:  Pt is a 56 y.o. male who presents for annual physical exam. Pt is fasting today.   Social: He is an Electrical engineer, works at [employer]. He is not married. He is not sexually active.   Diet: Has a varied diet. Eats more fruits than vegetables. Does not consume a lot red meat, likes fish and seafood. Drinks mostly water.  Exercise: Does not do structured exercise but moves a lot at work.  Sleep: Gets about 6-7 hours of sleep a night.   Bowel movements: Consistent, daily.  He denies nocturia, weak urinary stream, and rectal pain.   Last annual exam: 2014 Last dental exam: 2017 Last vision exam: 2017 Last colonoscopy: Never Vaccinations      Tetanus: 2015      Has hx of HTN. Used to take lisinopril 63m. Stopped taking medication >3 years ago.   There are no active problems to display for this patient.   Current Outpatient Prescriptions on File Prior to Visit  Medication Sig Dispense Refill  . lisinopril (PRINIVIL,ZESTRIL) 20 MG tablet Take 20 mg by mouth daily.     No current facility-administered medications on file prior to visit.     Allergies  Allergen Reactions  . Food Other (See Comments)    No meat due to kidney stones    Social History   Social History  . Marital status: Single    Spouse name: N/A  . Number of children: N/A  . Years of education: N/A   Social History Main Topics  . Smoking status: Never Smoker  . Smokeless tobacco: Never Used  . Alcohol use Yes     Comment: recovering alcoholic   Last drink 28 yr ago  . Drug use: No  . Sexual activity: Not Asked   Other Topics Concern  . None   Social History Narrative  . None    Past Surgical History:  Procedure Laterality Date  . KNEE SURGERY Left     History reviewed. No pertinent family history.  Review of Systems  Constitutional: Negative for activity change, appetite change, chills, diaphoresis, fatigue, fever and  unexpected weight change.  HENT: Negative for congestion, dental problem, drooling, ear discharge, ear pain, facial swelling, hearing loss, mouth sores, nosebleeds, postnasal drip, rhinorrhea, sinus pain, sinus pressure, sneezing, sore throat, tinnitus, trouble swallowing and voice change.   Eyes: Negative for photophobia, pain, discharge, redness, itching and visual disturbance.  Respiratory: Negative for apnea, cough, choking, chest tightness, shortness of breath, wheezing and stridor.   Cardiovascular: Negative for chest pain, palpitations and leg swelling.  Gastrointestinal: Negative for abdominal distention, abdominal pain, anal bleeding, blood in stool, constipation, diarrhea, nausea, rectal pain and vomiting.  Endocrine: Negative for cold intolerance, heat intolerance, polydipsia, polyphagia and polyuria.  Genitourinary: Negative for decreased urine volume, difficulty urinating, discharge, dysuria, enuresis, flank pain, frequency, genital sores, hematuria, penile pain, penile swelling, scrotal swelling, testicular pain and urgency.  Musculoskeletal: Negative for arthralgias, back pain, gait problem, joint swelling, myalgias, neck pain and neck stiffness.  Skin: Negative for color change, pallor, rash and wound.  Allergic/Immunologic: Negative for environmental allergies, food allergies and immunocompromised state.  Neurological: Negative for dizziness, tremors, seizures, syncope, facial asymmetry, speech difficulty, weakness, light-headedness, numbness and headaches.  Hematological: Negative for adenopathy. Does not bruise/bleed easily.  Psychiatric/Behavioral: Negative for agitation, behavioral problems, confusion, decreased concentration, dysphoric mood, hallucinations, self-injury, sleep disturbance and suicidal ideas. The patient is not nervous/anxious and  is not hyperactive.     Objective:  BP 132/82 (BP Location: Right Arm, Patient Position: Sitting, Cuff Size: Large)   Pulse 64   Temp  98.4 F (36.9 C) (Oral)   Resp 16   Ht 5' 11"  (1.803 m)   Wt 210 lb 3.2 oz (95.3 kg)   SpO2 97%   BMI 29.32 kg/m   Physical Exam  Constitutional: He is oriented to person, place, and time and well-developed, well-nourished, and in no distress.  HENT:  Head: Normocephalic and atraumatic.  Right Ear: Hearing, tympanic membrane, external ear and ear canal normal.  Left Ear: Hearing, tympanic membrane, external ear and ear canal normal.  Nose: Nose normal.  Mouth/Throat: Uvula is midline, oropharynx is clear and moist and mucous membranes are normal. No oropharyngeal exudate.  Eyes: Conjunctivae and EOM are normal. Pupils are equal, round, and reactive to light.  Neck: Trachea normal and normal range of motion.  Cardiovascular: Normal rate, regular rhythm, normal heart sounds and intact distal pulses.   Pulmonary/Chest: Effort normal and breath sounds normal.  Abdominal: Soft. Normal appearance and bowel sounds are normal.  Musculoskeletal: Normal range of motion.  Lymphadenopathy:       Head (right side): No submental, no submandibular, no tonsillar, no preauricular, no posterior auricular and no occipital adenopathy present.       Head (left side): No submental, no submandibular, no tonsillar, no preauricular, no posterior auricular and no occipital adenopathy present.    He has no cervical adenopathy.       Right: No supraclavicular adenopathy present.       Left: No supraclavicular adenopathy present.  Neurological: He is alert and oriented to person, place, and time. He has normal sensation, normal strength and normal reflexes. Gait normal.  Skin: Skin is warm and dry.  Psychiatric: Affect normal.  Vitals reviewed.   Visual Acuity Screening   Right eye Left eye Both eyes  Without correction: 20/40 20/30 20/20   With correction:       Assessment and Plan :  Discussed healthy lifestyle, diet, exercise, preventative care, vaccinations, and addressed patient's concerns. Plan for  follow up in one year. Otherwise, plan for specific conditions below.  1. Annual physical exam Await lab results   2. Screening, anemia, deficiency, iron - CBC with Differential/Platelet  3. Screening, lipid - Lipid panel  4. Screening for thyroid disorder - TSH  5. Screening for metabolic disorder - ORV61+BPPH  6. Screen for colon cancer - Cologuard  Tenna Delaine PA-C  [facility] 10/05/2016 4:16 PM

## 2016-10-05 NOTE — Patient Instructions (Addendum)
Your physical exam and vitals were great today. Keep up the good work. Try to work on increasing your physical activity as this will help with weight. As long as your labs are normal, you can follow up in one year. Thank you for letting me participate in your health and well being.   IF you received an x-ray today, you will receive an invoice from [facility]. Please contact [facility] at [phone] with questions or concerns regarding your invoice.   IF you received labwork today, you will receive an invoice from [facility]. Please contact [facility] at [phone] with questions or concerns regarding your invoice.   Our billing staff will not be able to assist you with questions regarding bills from these companies.  You will be contacted with the lab results as soon as they are available. The fastest way to get your results is to activate your My Chart account. Instructions are located on the last page of this paperwork. If you have not heard from [place] regarding the results in 2 weeks, please contact this office.      How to Increase Your Level of Physical Activity Getting regular physical activity is important for your overall health and well-being. Most people do not get enough exercise. There are easy ways to increase your level of physical activity, even if you have not been very active in the past or you are just starting out. Why is physical activity important? Physical activity has many short-term and long-term health benefits. Regular exercise can:  Help you lose weight or maintain a healthy weight.  Strengthen your muscles and bones.  Boost your mood and improve self-esteem.  Reduce your risk of certain long-term (chronic) diseases, like heart disease, cancer, and diabetes.  Help you stay capable of walking and moving around (mobile) as you age.  Prevent accidents, such as falls, as you age.  Increase life expectancy. What are the benefits of being  physically active on a regular basis? In addition to improving your physical health, being physically active on most days of the week can help you in ways that you may not expect. Benefits of regular physical activity may include:  Feeling good about your body.  Being able to move around more easily and for longer periods of time without getting tired (increased stamina).  Finding new sources of fun and enjoyment.  Meeting new people who share a common interest.  Being able to fight off illness better (enhanced immunity).  Being able to sleep better. What can happen if I am not physically active on a regular basis? Not getting enough physical activity can lead to an unhealthy lifestyle and future health problems. This can increase your chances of:  Becoming overweight or obese.  Becoming sick.  Developing chronic illnesses, like heart disease or diabetes.  Having mental health problems, like depression or anxiety.  Having sleep problems.  Having trouble walking or getting yourself around (reduced mobility).  Injuring yourself in a fall as you get older. What steps can I take to be more physically active?  Check with your health care provider about how to get started. Ask your health care provider what activities are safe for you.  Start out slowly. Walking or doing some simple chair exercises is a good place to start, especially if you have not been active before or for a long time.  Try to find activities that you enjoy. You are more likely to commit to an exercise routine if it does not feel like  a chore.  If you have bone or joint problems, choose low-impact exercises, like walking or swimming.  Include physical activity in your everyday routine.  Invite friends or family members to exercise with you. This also will help you commit to your workout plan.  Set goals that you can work toward.  Aim for at least 150 minutes of moderate-intensity exercise each week.  Examples of moderate-intensity exercise include walking or riding a bike. Where to find more information:  Centers for Disease Control and Prevention: JokeRule.co.ukwww.cdc.gov/physicalactivity/index.html  President's Council on [employer], Sports [employer] Nutrition www.http://smith-thompson.com/fitness.gov/resource-center  ChooseMyPlate: MissedFlights.com.brwww.choosemyplate.gov/physical-activity Contact a health care provider if:  You have headaches, muscle aches, or joint pain.  You feel dizzy or light-headed while exercising.  You faint.  You have chest pain while exercising. Summary  Exercise benefits your mind and body at any age, even if you are just starting out.  If you have a chronic illness or have not been active for a while, check with your health care provider before increasing your physical activity.  Choose activities that are safe and enjoyable for you.Ask your health care provider what activities are safe for you.  Start slowly. Tell your health care provider if you have problems as you start to increase your activity level. This information is not intended to replace advice given to you by your health care provider. Make sure you discuss any questions you have with your health care provider. Document Released: 06/15/2016 Document Revised: 06/15/2016 Document Reviewed: 06/15/2016 Elsevier Interactive Patient Education  2017 Elsevier Inc.  Heart-Healthy Eating Plan Heart-healthy meal planning includes:  Limiting unhealthy fats.  Increasing healthy fats.  Making other small dietary changes. You may need to talk with your doctor or a diet specialist (dietitian) to create an eating plan that is right for you. What types of fat should I choose?  Choose healthy fats. These include olive oil and canola oil, flaxseeds, walnuts, almonds, and seeds.  Eat more omega-3 fats. These include salmon, mackerel, sardines, tuna, flaxseed oil, and ground flaxseeds. Try to eat fish at least twice each week.  Limit saturated  fats.  Saturated fats are often found in animal products, such as meats, butter, and cream.  Plant sources of saturated fats include palm oil, palm kernel oil, and coconut oil.  Avoid foods with partially hydrogenated oils in them. These include stick margarine, some tub margarines, cookies, crackers, and other baked goods. These contain trans fats. What general guidelines do I need to follow?  Check food labels carefully. Identify foods with trans fats or high amounts of saturated fat.  Fill one half of your plate with vegetables and green salads. Eat 4-5 servings of vegetables per day. A serving of vegetables is:  1 cup of raw leafy vegetables.   cup of raw or cooked cut-up vegetables.   cup of vegetable juice.  Fill one fourth of your plate with whole grains. Look for the word "whole" as the first word in the ingredient list.  Fill one fourth of your plate with lean protein foods.  Eat 4-5 servings of fruit per day. A serving of fruit is:  One medium whole fruit.   cup of dried fruit.   cup of fresh, frozen, or canned fruit.   cup of 100% fruit juice.  Eat more foods that contain soluble fiber. These include apples, broccoli, carrots, beans, peas, and barley. Try to get 20-30 g of fiber per day.  Eat more home-cooked food. Eat less restaurant, buffet, and fast food.  Limit or avoid alcohol.  Limit foods high in starch and sugar.  Avoid fried foods.  Avoid frying your food. Try baking, boiling, grilling, or broiling it instead. You can also reduce fat by:  Removing the skin from poultry.  Removing all visible fats from meats.  Skimming the fat off of stews, soups, and gravies before serving them.  Steaming vegetables in water or broth.  Lose weight if you are overweight.  Eat 4-5 servings of nuts, legumes, and seeds per week:  One serving of dried beans or legumes equals  cup after being cooked.  One serving of nuts equals 1 ounces.  One serving  of seeds equals  ounce or one tablespoon.  You may need to keep track of how much salt or sodium you eat. This is especially true if you have high blood pressure. Talk with your doctor or dietitian to get more information. What foods can I eat? Grains  Breads, including [place], white, pita, wheat, raisin, rye, oatmeal, and [place]. Tortillas that are neither fried nor made with lard or trans fat. Low-fat rolls, including hotdog and hamburger buns and English muffins. Biscuits. Muffins. Waffles. Pancakes. Light popcorn. Whole-grain cereals. Flatbread. Melba toast. Pretzels. Breadsticks. Rusks. Low-fat snacks. Low-fat crackers, including oyster, saltine, matzo, graham, animal, and rye. Rice and pasta, including brown rice and pastas that are made with whole wheat. Vegetables  All vegetables. Fruits  All fruits, but limit coconut. Meats and Other Protein Sources  Lean, well-trimmed beef, veal, pork, and lamb. Chicken and [place] without skin. All fish and shellfish. Wild duck, rabbit, pheasant, and venison. Egg whites or low-cholesterol egg substitutes. Dried beans, peas, lentils, and tofu. Seeds and most nuts. Dairy  Low-fat or nonfat cheeses, including ricotta, string, and mozzarella. Skim or 1% milk that is liquid, powdered, or evaporated. Buttermilk that is made with low-fat milk. Nonfat or low-fat yogurt. Beverages  Mineral water. Diet carbonated beverages. Sweets and Desserts  Sherbets and fruit ices. Honey, jam, marmalade, jelly, and syrups. Meringues and gelatins. Pure sugar candy, such as hard candy, jelly beans, gumdrops, mints, marshmallows, and small amounts of dark chocolate. [employer]. Eat all sweets and desserts in moderation. Fats and Oils  Nonhydrogenated (trans-free) margarines. Vegetable oils, including soybean, sesame, sunflower, olive, peanut, safflower, corn, canola, and cottonseed. Salad dressings or mayonnaise made with a vegetable oil. Limit added fats and oils that  you use for cooking, baking, salads, and as spreads. Other  Cocoa powder. Coffee and tea. All seasonings and condiments. The items listed above may not be a complete list of recommended foods or beverages. Contact your dietitian for more options.  What foods are not recommended? Grains  Breads that are made with saturated or trans fats, oils, or whole milk. Croissants. Butter rolls. Cheese breads. Sweet rolls. Donuts. Buttered popcorn. Chow mein noodles. High-fat crackers, such as cheese or butter crackers. Meats and Other Protein Sources  Fatty meats, such as hotdogs, short ribs, sausage, spareribs, bacon, rib eye roast or steak, and mutton. High-fat deli meats, such as salami and bologna. Caviar. Domestic duck and goose. Organ meats, such as kidney, liver, sweetbreads, and heart. Dairy  Cream, sour cream, cream cheese, and creamed cottage cheese. Whole-milk cheeses, including blue (bleu), [address], [place], [place], [address], [place], [address], cheddar, [place], and [place]. Whole or 2% milk that is liquid, evaporated, or condensed. Whole buttermilk. Cream sauce or high-fat cheese sauce. Yogurt that is made from whole milk. Beverages  Regular sodas and juice drinks with added sugar.  Sweets and Desserts  Frosting. Pudding. Cookies. Cakes other than angel food cake. Candy that has milk chocolate or white chocolate, hydrogenated fat, butter, coconut, or unknown ingredients. Buttered syrups. Full-fat ice cream or ice cream drinks. Fats and Oils  Gravy that has suet, meat fat, or shortening. Cocoa butter, hydrogenated oils, palm oil, coconut oil, palm kernel oil. These can often be found in baked products, candy, fried foods, nondairy creamers, and whipped toppings. Solid fats and shortenings, including bacon fat, salt pork, lard, and butter. Nondairy cream substitutes, such as coffee creamers and sour cream substitutes. Salad dressings that are made of unknown oils, cheese, or sour cream. The items  listed above may not be a complete list of foods and beverages to avoid. Contact your dietitian for more information.  This information is not intended to replace advice given to you by your health care provider. Make sure you discuss any questions you have with your health care provider. Document Released: 12/26/2011 Document Revised: 12/02/2015 Document Reviewed: 12/18/2013 Elsevier Interactive Patient Education  2017 [employer].

## 2016-10-06 LAB — LIPID PANEL
CHOL/HDL RATIO: 3.4 ratio (ref 0.0–5.0)
Cholesterol, Total: 247 mg/dL — ABNORMAL HIGH (ref 100–199)
HDL: 73 mg/dL (ref >39)
LDL Calculated: 154 mg/dL — ABNORMAL HIGH (ref 0–99)
Triglycerides: 98 mg/dL (ref 0–149)
VLDL CHOLESTEROL CAL: 20 mg/dL (ref 5–40)

## 2016-10-06 LAB — CBC WITH DIFFERENTIAL/PLATELET
Basophils Absolute: 0 10*3/uL (ref 0.0–0.2)
Basos: 1 %
EOS (ABSOLUTE): 0.4 10*3/uL (ref 0.0–0.4)
EOS: 6 %
Hematocrit: 44.1 % (ref 37.5–51.0)
Hemoglobin: 14.7 g/dL (ref 13.0–17.7)
IMMATURE GRANS (ABS): 0 10*3/uL (ref 0.0–0.1)
IMMATURE GRANULOCYTES: 0 %
Lymphocytes Absolute: 2.3 10*3/uL (ref 0.7–3.1)
Lymphs: 30 %
MCH: 29.7 pg (ref 26.6–33.0)
MCHC: 33.3 g/dL (ref 31.5–35.7)
MCV: 89 fL (ref 79–97)
MONOCYTES: 4 %
Monocytes Absolute: 0.3 10*3/uL (ref 0.1–0.9)
NEUTROS PCT: 59 %
Neutrophils Absolute: 4.6 10*3/uL (ref 1.4–7.0)
PLATELETS: 232 10*3/uL (ref 150–379)
RBC: 4.95 x10E6/uL (ref 4.14–5.80)
RDW: 13.8 % (ref 12.3–15.4)
WBC: 7.7 10*3/uL (ref 3.4–10.8)

## 2016-10-06 LAB — CMP14+EGFR
ALT: 15 IU/L (ref 0–44)
AST: 19 IU/L (ref 0–40)
Albumin/Globulin Ratio: 1.8 (ref 1.2–2.2)
Albumin: 5.1 g/dL (ref 3.5–5.5)
Alkaline Phosphatase: 100 IU/L (ref 39–117)
BUN/Creatinine Ratio: 17 (ref 9–20)
BUN: 19 mg/dL (ref 6–24)
Bilirubin Total: 0.6 mg/dL (ref 0.0–1.2)
CALCIUM: 10.1 mg/dL (ref 8.7–10.2)
CO2: 23 mmol/L (ref 18–29)
Chloride: 100 mmol/L (ref 96–106)
Creatinine, Ser: 1.11 mg/dL (ref 0.76–1.27)
GFR, EST AFRICAN AMERICAN: 85 mL/min/{1.73_m2} (ref >59)
GFR, EST NON AFRICAN AMERICAN: 73 mL/min/{1.73_m2} (ref >59)
GLOBULIN, TOTAL: 2.9 g/dL (ref 1.5–4.5)
Glucose: 87 mg/dL (ref 65–99)
POTASSIUM: 4.4 mmol/L (ref 3.5–5.2)
Sodium: 144 mmol/L (ref 134–144)
TOTAL PROTEIN: 8 g/dL (ref 6.0–8.5)

## 2016-10-06 LAB — TSH: TSH: 1.66 u[IU]/mL (ref 0.450–4.500)

## 2016-10-07 ENCOUNTER — Telehealth: Payer: Self-pay | Admitting: General Practice

## 2016-11-13 DIAGNOSIS — Z1212 Encounter for screening for malignant neoplasm of rectum: Secondary | ICD-10-CM | POA: Diagnosis not present

## 2016-11-13 DIAGNOSIS — Z1211 Encounter for screening for malignant neoplasm of colon: Secondary | ICD-10-CM | POA: Diagnosis not present

## 2016-11-23 LAB — COLOGUARD: Cologuard: NEGATIVE

## 2017-08-23 DIAGNOSIS — N200 Calculus of kidney: Secondary | ICD-10-CM | POA: Diagnosis not present

## 2017-08-23 DIAGNOSIS — R112 Nausea with vomiting, unspecified: Secondary | ICD-10-CM | POA: Diagnosis not present

## 2017-08-23 DIAGNOSIS — N201 Calculus of ureter: Secondary | ICD-10-CM | POA: Diagnosis not present

## 2017-08-23 DIAGNOSIS — R109 Unspecified abdominal pain: Secondary | ICD-10-CM | POA: Diagnosis not present

## 2017-08-23 DIAGNOSIS — N132 Hydronephrosis with renal and ureteral calculous obstruction: Secondary | ICD-10-CM | POA: Diagnosis not present

## 2020-07-02 ENCOUNTER — Encounter (HOSPITAL_COMMUNITY): Payer: Self-pay | Admitting: Emergency Medicine

## 2020-07-02 ENCOUNTER — Emergency Department (HOSPITAL_COMMUNITY): Payer: BC Managed Care – PPO

## 2020-07-02 ENCOUNTER — Other Ambulatory Visit: Payer: Self-pay

## 2020-07-02 ENCOUNTER — Emergency Department (HOSPITAL_COMMUNITY)
Admission: EM | Admit: 2020-07-02 | Discharge: 2020-07-02 | Disposition: A | Discharge status: Home / Self Care | Payer: BC Managed Care – PPO | Attending: Emergency Medicine | Admitting: Emergency Medicine

## 2020-07-02 DIAGNOSIS — U071 COVID-19: Secondary | ICD-10-CM | POA: Diagnosis not present

## 2020-07-02 DIAGNOSIS — I10 Essential (primary) hypertension: Secondary | ICD-10-CM | POA: Insufficient documentation

## 2020-07-02 DIAGNOSIS — N179 Acute kidney failure, unspecified: Secondary | ICD-10-CM | POA: Diagnosis not present

## 2020-07-02 DIAGNOSIS — R531 Weakness: Secondary | ICD-10-CM | POA: Diagnosis present

## 2020-07-02 LAB — RESP PANEL BY RT-PCR (FLU A&B, COVID) ARPGX2
Influenza A by PCR: NEGATIVE
Influenza B by PCR: NEGATIVE
SARS Coronavirus 2 by RT PCR: POSITIVE — AB

## 2020-07-02 LAB — CBC
HCT: 41.5 % (ref 39.0–52.0)
Hemoglobin: 13.9 g/dL (ref 13.0–17.0)
MCH: 30.2 pg (ref 26.0–34.0)
MCHC: 33.5 g/dL (ref 30.0–36.0)
MCV: 90.2 fL (ref 80.0–100.0)
Platelets: 193 10*3/uL (ref 150–400)
RBC: 4.6 MIL/uL (ref 4.22–5.81)
RDW: 12.8 % (ref 11.5–15.5)
WBC: 6.6 10*3/uL (ref 4.0–10.5)
nRBC: 0 % (ref 0.0–0.2)

## 2020-07-02 LAB — CBG MONITORING, ED: Glucose-Capillary: 123 mg/dL — ABNORMAL HIGH (ref 70–99)

## 2020-07-02 LAB — BASIC METABOLIC PANEL
Anion gap: 13 (ref 5–15)
BUN: 38 mg/dL — ABNORMAL HIGH (ref 6–20)
CO2: 22 mmol/L (ref 22–32)
Calcium: 9.1 mg/dL (ref 8.9–10.3)
Chloride: 98 mmol/L (ref 98–111)
Creatinine, Ser: 1.81 mg/dL — ABNORMAL HIGH (ref 0.61–1.24)
GFR, Estimated: 42 mL/min — ABNORMAL LOW (ref >60)
Glucose, Bld: 146 mg/dL — ABNORMAL HIGH (ref 70–99)
Potassium: 4.9 mmol/L (ref 3.5–5.1)
Sodium: 133 mmol/L — ABNORMAL LOW (ref 135–145)

## 2020-07-02 MED ORDER — SODIUM CHLORIDE 0.9 % IV BOLUS
1000.0000 mL | Freq: Once | INTRAVENOUS | Status: AC
  Administered 2020-07-02: 1000 mL via INTRAVENOUS

## 2020-07-02 NOTE — Discharge Instructions (Addendum)
You were evaluated in the Emergency Department and after careful evaluation, we did not find any emergent condition requiring admission or further testing in the hospital.  Your exam/testing today is overall reassuring.  Your symptoms seem to be due to COVID-19.  You tested positive here in the emergency department.  You sustained mild injury to your kidneys because of dehydration.  As we discussed, it is very important that you continue to hydrate at home.  Please return to the Emergency Department if you experience any worsening of your condition.   Thank you for allowing [place] to be a part of your care.

## 2020-07-02 NOTE — ED Triage Notes (Addendum)
Patient arrives to the ED with c/o weakness x2 weeks. Pt reports loss of appetite and body ache. Pt states he now experiences dyspnea on exertion which is not normal for him. Denies fevers, chills, CP, N/V/D.

## 2020-07-02 NOTE — ED Provider Notes (Signed)
[facility] Emergency Department Provider Note MRN:  [id number]  Arrival date & time: 07/02/20     Chief Complaint   Weakness   History of Present Illness   Cody Haynes is a 59 y.o. year-old male with a history of hypertension presenting to the ED with chief complaint of weakness.  2 weeks of generalized weakness, malaise, poor appetite.  Feels too tired to walk or talk, gets short of breath when he tries to ambulate.  May have had fever earlier last week but none since.  Denies cough, no nausea vomiting, no diarrhea or constipation, no chest pain or shortness of breath, no abdominal pain.  Symptoms are mild to moderate, constant, no other exacerbating or alleviating factors.  Review of Systems  A complete 10 system review of systems was obtained and all systems are negative except as noted in the HPI and PMH.   Patient's Health History    Past Medical History:  Diagnosis Date  . Hypertension   . Kidney stones     Past Surgical History:  Procedure Laterality Date  . KNEE SURGERY Left     History reviewed. No pertinent family history.  Social History   Socioeconomic History  . Marital status: Single    Spouse name: Not on file  . Number of children: Not on file  . Years of education: Not on file  . Highest education level: Not on file  Occupational History  . Not on file  Tobacco Use  . Smoking status: Never Smoker  . Smokeless tobacco: Never Used  Substance and Sexual Activity  . Alcohol use: Yes    Comment: recovering alcoholic   Last drink 29 yr ago  . Drug use: No  . Sexual activity: Never  Other Topics Concern  . Not on file  Social History Narrative  . Not on file   Social Determinants of Health   Financial Resource Strain: Not on file  Food Insecurity: Not on file  Transportation Needs: Not on file  Physical Activity: Not on file  Stress: Not on file  Social Connections: Not on file  Intimate Partner Violence: Not on file      Physical Exam   Vitals:   07/02/20 1700 07/02/20 1715  BP: 120/89 123/77  Pulse: 77 77  Resp: (!) 23 (!) 28  Temp:    SpO2: 93% 100%    CONSTITUTIONAL: Chronically ill-appearing, NAD NEURO:  Alert and oriented x 3, no focal deficits EYES:  eyes equal and reactive ENT/NECK:  no LAD, no JVD CARDIO: Regular rate, well-perfused, normal S1 and S2 PULM:  CTAB no wheezing or rhonchi GI/GU:  normal bowel sounds, non-distended, non-tender MSK/SPINE:  No gross deformities, no edema SKIN:  no rash, atraumatic PSYCH:  Appropriate speech and behavior  *Additional and/or pertinent findings included in MDM below  Diagnostic and Interventional Summary    EKG Interpretation  Date/Time:  Friday July 02 2020 14:03:17 EST Ventricular Rate:  90 PR Interval:  162 QRS Duration: 68 QT Interval:  334 QTC Calculation: 408 R Axis:   42 Text Interpretation: Normal sinus rhythm Normal ECG Confirmed by Kennis Carina [phone]) on 07/02/2020 3:01:32 PM      Labs Reviewed  RESP PANEL BY RT-PCR (FLU A&B, COVID) ARPGX2 - Abnormal; Notable for the following components:      Result Value   SARS Coronavirus 2 by RT PCR POSITIVE (*)    All other components within normal limits  BASIC METABOLIC PANEL - Abnormal; Notable  for the following components:   Sodium 133 (*)    Glucose, Bld 146 (*)    BUN 38 (*)    Creatinine, Ser 1.81 (*)    GFR, Estimated 42 (*)    All other components within normal limits  CBG MONITORING, ED - Abnormal; Notable for the following components:   Glucose-Capillary 123 (*)    All other components within normal limits  CBC  URINALYSIS, ROUTINE W REFLEX MICROSCOPIC    DG Chest Portable 1 View  Final Result      Medications  sodium chloride 0.9 % bolus 1,000 mL (0 mLs Intravenous Stopped 07/02/20 1724)     Procedures  /  Critical Care Procedures  ED Course and Medical Decision Making  I have reviewed the triage vital signs, the nursing notes, and pertinent  available records from the EMR.  Listed above are laboratory and imaging tests that I personally ordered, reviewed, and interpreted and then considered in my medical decision making (see below for details).  Symptoms suggestive of viral illness, possibly flu or coronavirus.  Patient is not vaccinated.  No increased work of breathing at rest, vital signs reassuring.  Awaiting labs, chest x-ray, Covid swab.     Covid test is positive.  Labs reveal AKI but otherwise reassuring.  Patient is sitting comfortably, 97% on room air.  No indication for admission.  Provided IV fluids and patient agrees to drink more fluids at home.  Elmer Sow. Pilar Plate, MD [facility] Emergency Medicine [facility] [email or web address]  Final Clinical Impressions(s) / ED Diagnoses     ICD-10-CM   1. COVID-19  U07.1   2. AKI (acute kidney injury) (HCC)  N17.9     ED Discharge Orders    None       Discharge Instructions Discussed with and Provided to Patient:     Discharge Instructions     You were evaluated in the Emergency Department and after careful evaluation, we did not find any emergent condition requiring admission or further testing in the hospital.  Your exam/testing today is overall reassuring.  Your symptoms seem to be due to COVID-19.  You tested positive here in the emergency department.  You sustained mild injury to your kidneys because of dehydration.  As we discussed, it is very important that you continue to hydrate at home.  Please return to the Emergency Department if you experience any worsening of your condition.   Thank you for allowing [place] to be a part of your care.       Sabas Sous, MD 07/02/20 1725
